# Patient Record
Sex: Female | Born: 1970 | Race: Black or African American | Hispanic: No | Marital: Single
Health system: Southern US, Community
[De-identification: ages and names within clinical notes are randomized; demographics above are authoritative.]

---

## 2011-03-18 ENCOUNTER — Ambulatory Visit: Payer: Self-pay | Admitting: Specialist

## 2011-03-19 ENCOUNTER — Ambulatory Visit: Payer: Self-pay | Admitting: Specialist

## 2011-03-20 ENCOUNTER — Ambulatory Visit: Payer: Self-pay | Admitting: Specialist

## 2011-04-19 ENCOUNTER — Ambulatory Visit: Payer: Self-pay | Admitting: Specialist

## 2011-04-29 ENCOUNTER — Inpatient Hospital Stay: Payer: Self-pay | Admitting: Specialist

## 2011-04-30 LAB — BASIC METABOLIC PANEL
BUN: 12 mg/dL (ref 7–18)
Co2: 22 mmol/L (ref 21–32)
Creatinine: 1.1 mg/dL (ref 0.60–1.30)
EGFR (African American): >60
EGFR (Non-African Amer.): 58 — ABNORMAL LOW
Sodium: 137 mmol/L (ref 136–145)

## 2011-04-30 LAB — CBC WITH DIFFERENTIAL/PLATELET
Eosinophil #: 0 10*3/uL (ref 0.0–0.7)
Eosinophil %: 0 %
HCT: 30.7 % — ABNORMAL LOW (ref 35.0–47.0)
Lymphocyte #: 0.9 10*3/uL — ABNORMAL LOW (ref 1.0–3.6)
MCV: 84 fL (ref 80–100)
Monocyte %: 4.2 %
Neutrophil #: 7.9 10*3/uL — ABNORMAL HIGH (ref 1.4–6.5)
RBC: 3.65 10*6/uL — ABNORMAL LOW (ref 3.80–5.20)
WBC: 9.2 10*3/uL (ref 3.6–11.0)

## 2011-04-30 LAB — ALBUMIN: Albumin: 3.5 g/dL (ref 3.4–5.0)

## 2013-05-20 IMAGING — RF DG UGI W/O KUB
12 series · 15 of 22 positions shown · non-contrast
Comparison: none

REASON FOR EXAM: HTN Diabetes GERD High Cholosterol
COMMENTS:

[Series 1: fluoro_barium 2fps_bw · 0.18mm/px · 1 of 2 frames shown (1 of 12)]
[frame 1/2]
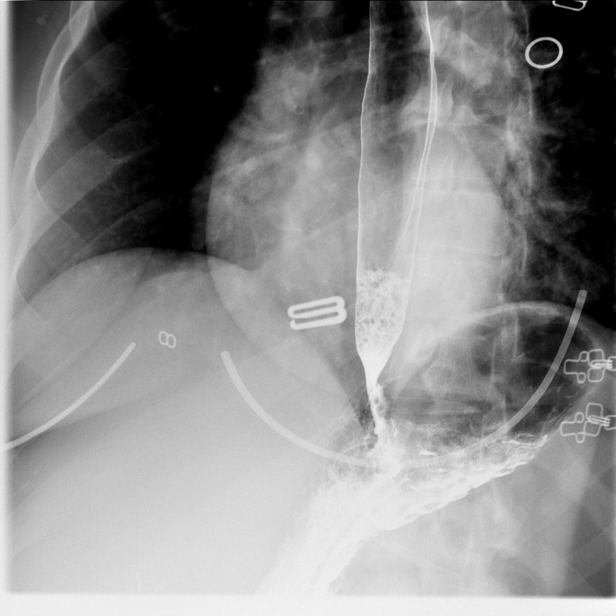

[Series 2: fluoro_barium 2fps_bw · 0.18mm/px · 1 of 1 slices shown (2 of 12)]
[im 1/1]
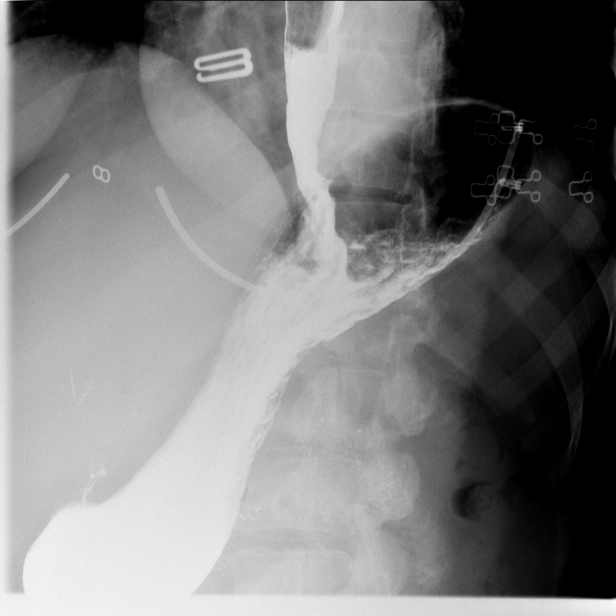

[Series 3: fluoro_barium 2fps_bw · 0.18mm/px · 1 of 1 slices shown (3 of 12)]
[im 1/1]
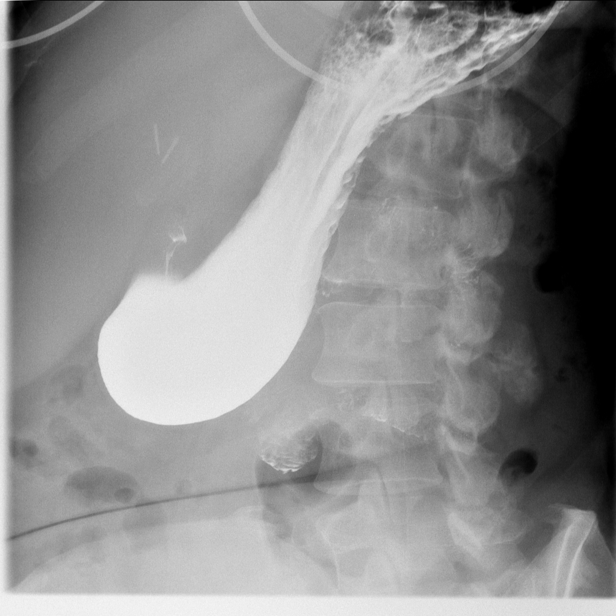

[Series 4: fluoro_barium 2fps_bw · 0.18mm/px · 1 of 2 frames shown (4 of 12)]
[frame 2/2]
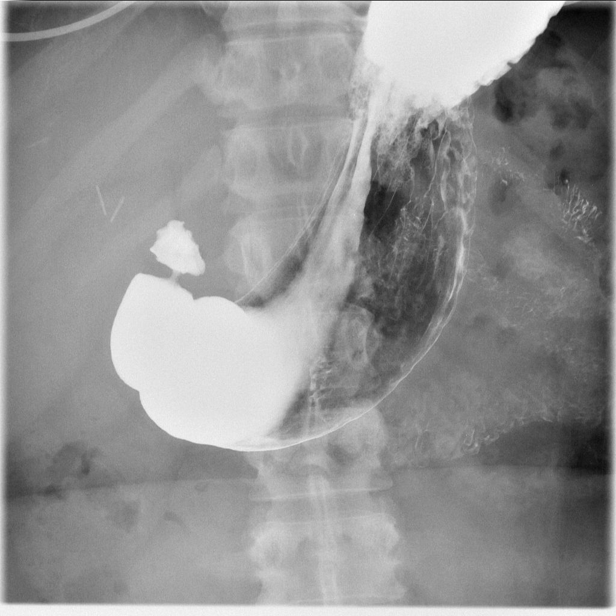

[Series 5: fluoro_barium 2fps_bw · 0.18mm/px · 1 of 1 slices shown (5 of 12)]
[im 1/1]
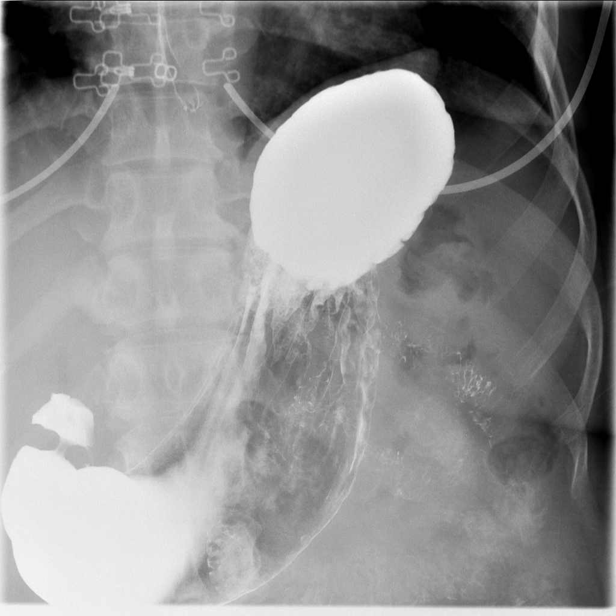

[Series 6: fluoro_barium 2fps_bw · 0.20mm/px · 1 of 2 frames shown (6 of 12)]
[frame 2/2]
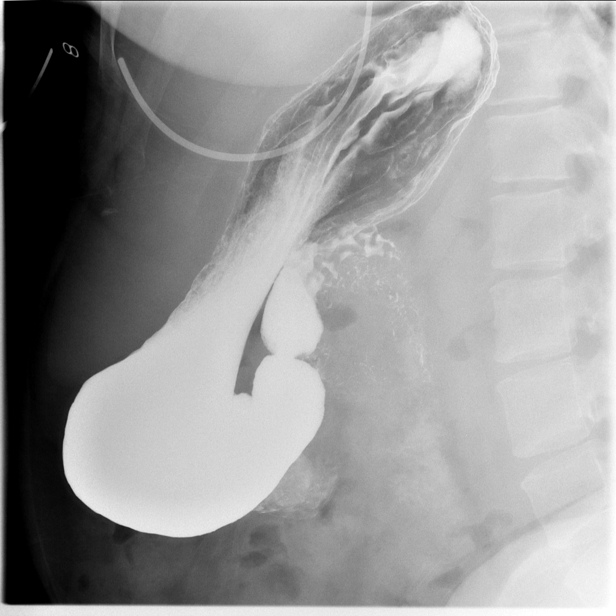

[Series 7: fluoro_barium 2fps_bw · 0.19mm/px · 1 of 2 frames shown (7 of 12)]
[frame 1/2]
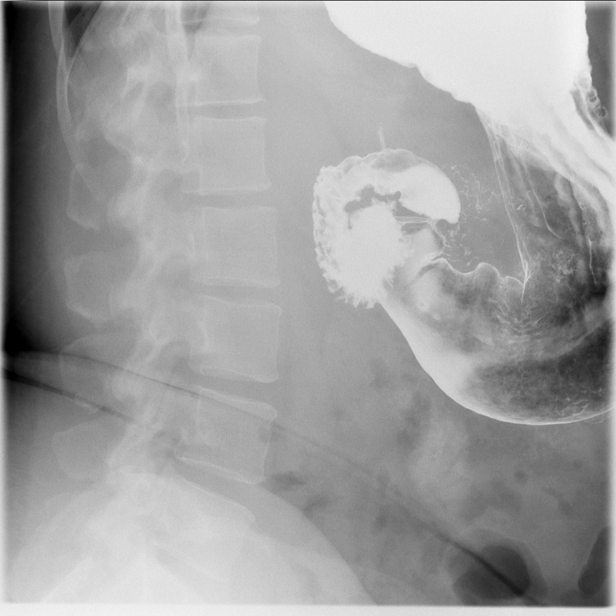

[Series 8: fluoro_barium 2fps_bw · 0.19mm/px · 1 of 1 slices shown (8 of 12)]
[im 1/1]
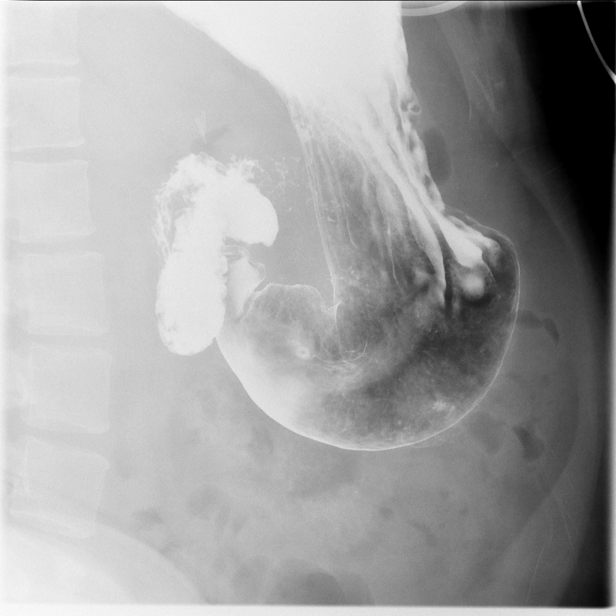

[Series 9: fluoro_barium 2fps_bw · 0.19mm/px · 1 of 1 slices shown (9 of 12)]
[im 1/1]
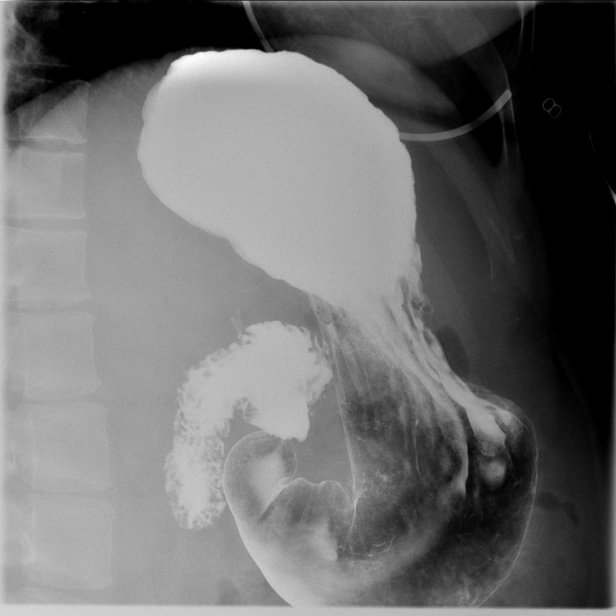

[Series 10: fluoro_barium 2fps_bw · 0.21mm/px · 1 of 1 slices shown (10 of 12)]
[im 1/1]
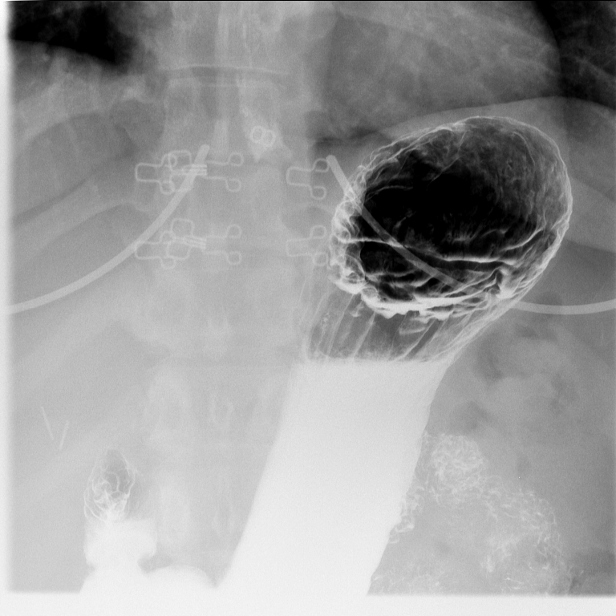

[Series 11: fluoro_barium 2fps_bw · 0.20mm/px · 2 of 20 frames shown (11 of 12)]
[frame 5/20]
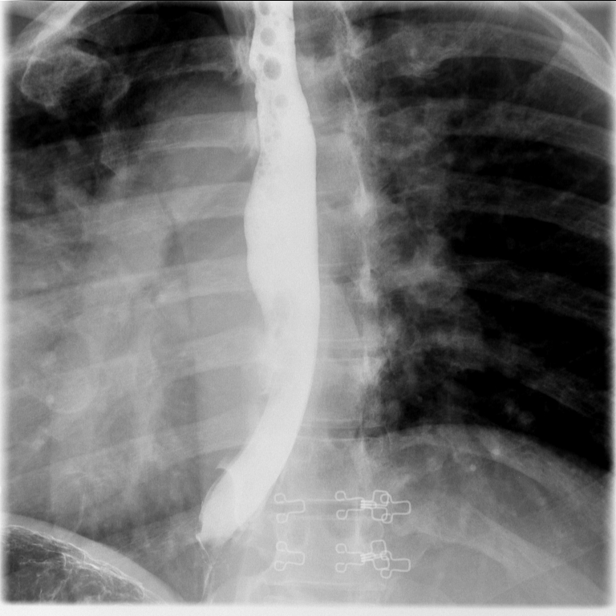
[frame 11/20]
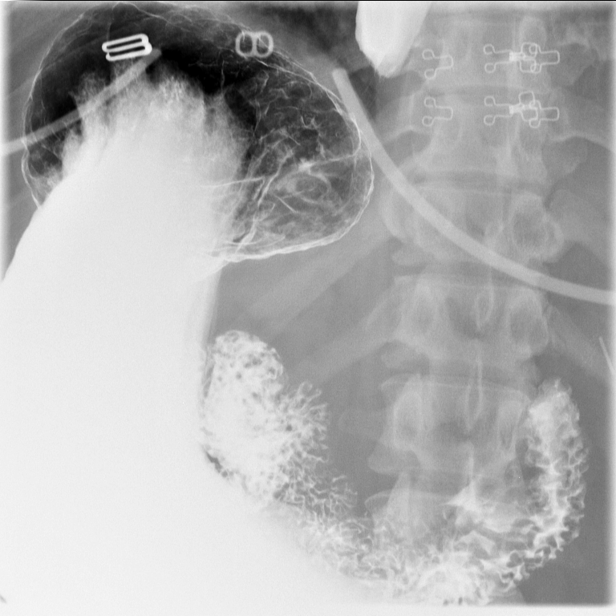

[Series 12: fluoro_barium 2fps_bw · 0.21mm/px · 3 of 5 frames shown (12 of 12)]
[frame 1/5]
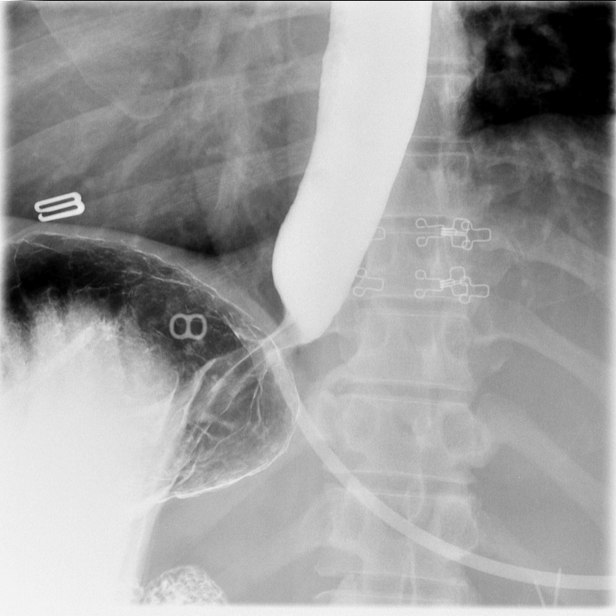
[frame 3/5]
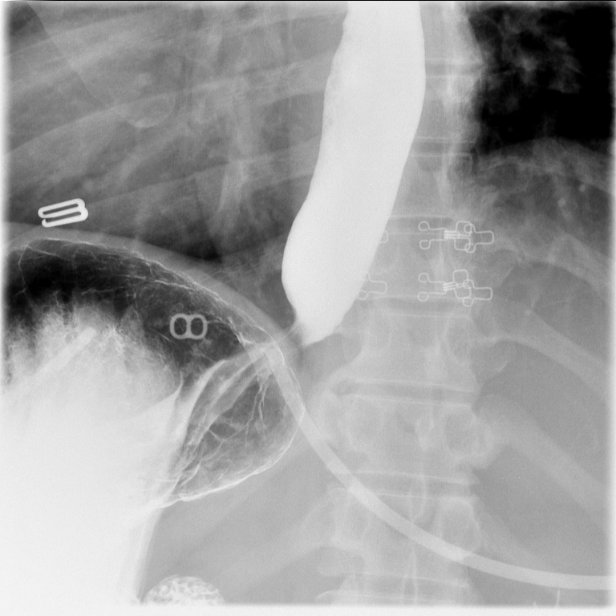
[frame 5/5]
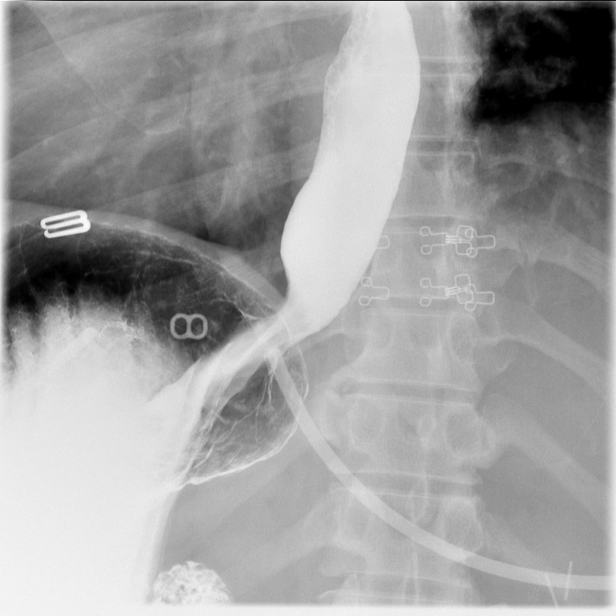

[15 of 22 positions shown; findings below may reference images not displayed]

PROCEDURE:     FL  - FL UPPER GI  - March 20, 2011 [DATE]

RESULT:     The anticipated procedure was discussed with Ms. Naitsirhc Cj. She
voiced her willingness to proceed. The patient ingested barium without
difficulty. The thoracic esophagus distended well. There is no evidence of a
hiatal hernia. The stomach distended well. The duodenal bulb and C-sweep
were normal in appearance. The gastric mucosal folds appeared normal. The
ligament of Treitz appears normally positioned. A 12 mm barium pill passed
without difficulty.
IMPRESSION: Normal upper GI series.

## 2014-08-21 NOTE — Op Note (Signed)
PATIENT NAME:  Veronica Yang, Veronica Yang MR#:  [id number] DATE OF BIRTH:  17-Jun-1970  DATE OF PROCEDURE:  04/29/2011  PREOPERATIVE DIAGNOSES:  1. Morbid obesity. 2. Diabetes.  POSTOPERATIVE DIAGNOSES:  1. Morbid obesity. 2. Diabetes.  PROCEDURE: Laparoscopic sleeve gastrectomy.  SURGEON: Primus BravoJon Bruce, MD  ASSISTANT:  Anabel Halonon Drinkwater, PA  ANESTHESIA:  General endotracheal.  COMPLICATIONS: None.  DRAINS: None.   ESTIMATED BLOOD LOSS: None.  FINDINGS: None.  INDICATION:  See History and Physical for details.   DETAILS OF PROCEDURE:  The patient was taken to the operating room and placed on the operating room table, in the supine position, with appropriate monitors and supplemental oxygen being delivered.  Broad spectrum IV antibiotics were administered. The patient was placed under general anesthesia without incident.  The abdomen was prepped and draped in the usual sterile fashion.  Access was obtained using 5 mm Optical trocar. Pneumoperitoneum was established without difficulty. Multiple other ports were placed in preparation for sleeve gastrectomy. A liver retractor was placed without incident. The entire stomach was mobilized from 5 cm from the pylorus all the way up to the fundus, and the fundus was mobilized off the left crura as well completely freeing up the posterior portion of the stomach. Posterior attachments were taken down so the crura could be visualized from both sides.  At that point, everything was removed from the stomach and a [address] [place]ie was placed down into the antrum. An Echelon green load stapler was used to bisect the antrum on first fire starting approximately 5 to 6 cm from the pylorus. I then continued up along the Bougie using a blue load stapler with excellent affect with care not to get too close to the Bougie itself, with minimal traction. This continued all the way up to the left crura. The excess stomach was placed on the side and the Bougie was removed and  endoscopy showed no evidence of obstruction at that time.  The excess stomach was removed through the abdominal cavity, and the wounds were closed using 4-0 Vicryl and Dermabond.  ____________________________ Primus BravoJon Bruce, MD jb:slb D: 04/29/2011 13:17:33 ET T: 04/29/2011 13:54:53 ET JOB#: [id number]  cc: Primus BravoJon Bruce, MD, <Dictator> Cletis AthensJON Marjean DonnaM BRUCE MD ELECTRONICALLY SIGNED 06/17/2011 16:43
